# Patient Record
Sex: Male | Born: 1990 | Race: White | Hispanic: No | Marital: Single | Smoking: Current every day smoker
Health system: Southern US, Community
[De-identification: ages and names within clinical notes are randomized; demographics above are authoritative.]

## PROBLEM LIST (undated history)

## (undated) DIAGNOSIS — F909 Attention-deficit hyperactivity disorder, unspecified type: Secondary | ICD-10-CM

## (undated) DIAGNOSIS — F319 Bipolar disorder, unspecified: Secondary | ICD-10-CM

## (undated) HISTORY — PX: HERNIA REPAIR: SHX51

## (undated) HISTORY — PX: EYE SURGERY: SHX253

---

## 2017-10-24 ENCOUNTER — Other Ambulatory Visit: Payer: Self-pay

## 2017-10-24 ENCOUNTER — Emergency Department (HOSPITAL_BASED_OUTPATIENT_CLINIC_OR_DEPARTMENT_OTHER): Payer: Self-pay

## 2017-10-24 ENCOUNTER — Encounter (HOSPITAL_BASED_OUTPATIENT_CLINIC_OR_DEPARTMENT_OTHER): Payer: Self-pay | Admitting: Emergency Medicine

## 2017-10-24 ENCOUNTER — Observation Stay (HOSPITAL_BASED_OUTPATIENT_CLINIC_OR_DEPARTMENT_OTHER)
Admission: EM | Admit: 2017-10-24 | Discharge: 2017-10-25 | Disposition: A | Discharge status: Home / Self Care | Payer: Self-pay | Attending: Internal Medicine | Admitting: Internal Medicine

## 2017-10-24 DIAGNOSIS — K2970 Gastritis, unspecified, without bleeding: Secondary | ICD-10-CM

## 2017-10-24 DIAGNOSIS — R112 Nausea with vomiting, unspecified: Secondary | ICD-10-CM | POA: Insufficient documentation

## 2017-10-24 DIAGNOSIS — E860 Dehydration: Secondary | ICD-10-CM | POA: Insufficient documentation

## 2017-10-24 DIAGNOSIS — R109 Unspecified abdominal pain: Secondary | ICD-10-CM | POA: Insufficient documentation

## 2017-10-24 DIAGNOSIS — F1010 Alcohol abuse, uncomplicated: Secondary | ICD-10-CM | POA: Insufficient documentation

## 2017-10-24 DIAGNOSIS — N179 Acute kidney failure, unspecified: Principal | ICD-10-CM | POA: Insufficient documentation

## 2017-10-24 DIAGNOSIS — F17200 Nicotine dependence, unspecified, uncomplicated: Secondary | ICD-10-CM | POA: Insufficient documentation

## 2017-10-24 DIAGNOSIS — H5461 Unqualified visual loss, right eye, normal vision left eye: Secondary | ICD-10-CM | POA: Insufficient documentation

## 2017-10-24 HISTORY — DX: Bipolar disorder, unspecified: F31.9

## 2017-10-24 HISTORY — DX: Attention-deficit hyperactivity disorder, unspecified type: F90.9

## 2017-10-24 LAB — COMPREHENSIVE METABOLIC PANEL
ALT: 18 U/L (ref 0–44)
ANION GAP: 9 (ref 5–15)
AST: 15 U/L (ref 15–41)
Albumin: 4.2 g/dL (ref 3.5–5.0)
Alkaline Phosphatase: 55 U/L (ref 38–126)
BUN: 17 mg/dL (ref 6–20)
CALCIUM: 10.5 mg/dL — AB (ref 8.9–10.3)
CO2: 28 mmol/L (ref 22–32)
CREATININE: 2.03 mg/dL — AB (ref 0.61–1.24)
Chloride: 100 mmol/L (ref 98–111)
GFR calc Af Amer: 50 mL/min — ABNORMAL LOW (ref >60)
GFR calc non Af Amer: 43 mL/min — ABNORMAL LOW (ref >60)
Glucose, Bld: 113 mg/dL — ABNORMAL HIGH (ref 70–99)
POTASSIUM: 3.8 mmol/L (ref 3.5–5.1)
Sodium: 137 mmol/L (ref 135–145)
TOTAL PROTEIN: 7.1 g/dL (ref 6.5–8.1)
Total Bilirubin: 0.8 mg/dL (ref 0.3–1.2)

## 2017-10-24 LAB — URINALYSIS, ROUTINE W REFLEX MICROSCOPIC
Bilirubin Urine: NEGATIVE
Glucose, UA: NEGATIVE mg/dL
Hgb urine dipstick: NEGATIVE
Ketones, ur: NEGATIVE mg/dL
Leukocytes, UA: NEGATIVE
Nitrite: NEGATIVE
Protein, ur: NEGATIVE mg/dL
Specific Gravity, Urine: <1.005 — ABNORMAL LOW (ref 1.005–1.030)
pH: 6 (ref 5.0–8.0)

## 2017-10-24 LAB — CBC WITH DIFFERENTIAL/PLATELET
BASOS ABS: 0 10*3/uL (ref 0.0–0.1)
Basophils Relative: 0 %
Eosinophils Absolute: 0.1 10*3/uL (ref 0.0–0.7)
Eosinophils Relative: 1 %
HEMATOCRIT: 40.6 % (ref 39.0–52.0)
HEMOGLOBIN: 14.5 g/dL (ref 13.0–17.0)
Lymphocytes Relative: 18 %
Lymphs Abs: 1.9 10*3/uL (ref 0.7–4.0)
MCH: 34.7 pg — ABNORMAL HIGH (ref 26.0–34.0)
MCHC: 35.7 g/dL (ref 30.0–36.0)
MCV: 97.1 fL (ref 78.0–100.0)
Monocytes Absolute: 1 10*3/uL (ref 0.1–1.0)
Monocytes Relative: 10 %
NEUTROS ABS: 7.2 10*3/uL (ref 1.7–7.7)
NEUTROS PCT: 71 %
Platelets: 257 10*3/uL (ref 150–400)
RBC: 4.18 MIL/uL — ABNORMAL LOW (ref 4.22–5.81)
RDW: 12.3 % (ref 11.5–15.5)
WBC: 10.2 10*3/uL (ref 4.0–10.5)

## 2017-10-24 LAB — LIPASE, BLOOD: Lipase: 24 U/L (ref 11–51)

## 2017-10-24 LAB — RAPID URINE DRUG SCREEN, HOSP PERFORMED
Amphetamines: NOT DETECTED
Barbiturates: NOT DETECTED
Benzodiazepines: NOT DETECTED
Cocaine: NOT DETECTED
OPIATES: NOT DETECTED
Tetrahydrocannabinol: POSITIVE — AB

## 2017-10-24 LAB — CK: Total CK: 50 U/L (ref 49–397)

## 2017-10-24 LAB — ACETAMINOPHEN LEVEL: Acetaminophen (Tylenol), Serum: <10 ug/mL — ABNORMAL LOW (ref 10–30)

## 2017-10-24 MED ORDER — FAMOTIDINE IN NACL 20-0.9 MG/50ML-% IV SOLN
20.0000 mg | Freq: Once | INTRAVENOUS | Status: AC
  Administered 2017-10-24: 20 mg via INTRAVENOUS
  Filled 2017-10-24: qty 50

## 2017-10-24 MED ORDER — ONDANSETRON HCL 4 MG/2ML IJ SOLN: 4.0000 mg | Freq: Four times a day (QID) | INTRAMUSCULAR | Status: DC | PRN

## 2017-10-24 MED ORDER — SODIUM CHLORIDE 0.9 % IV SOLN
INTRAVENOUS | Status: DC
  Administered 2017-10-24 – 2017-10-25 (×2): via INTRAVENOUS

## 2017-10-24 MED ORDER — LORAZEPAM 2 MG/ML IJ SOLN: 1.0000 mg | Freq: Four times a day (QID) | INTRAMUSCULAR | Status: DC | PRN

## 2017-10-24 MED ORDER — ONDANSETRON HCL 4 MG/2ML IJ SOLN
4.0000 mg | Freq: Once | INTRAMUSCULAR | Status: AC
  Administered 2017-10-24: 4 mg via INTRAVENOUS
  Filled 2017-10-24: qty 2

## 2017-10-24 MED ORDER — THIAMINE HCL 100 MG/ML IJ SOLN
Freq: Once | INTRAVENOUS | Status: AC
  Administered 2017-10-24: 18:00:00 via INTRAVENOUS
  Filled 2017-10-24: qty 1000

## 2017-10-24 MED ORDER — HYDROCODONE-ACETAMINOPHEN 5-325 MG PO TABS
1.0000 | ORAL_TABLET | ORAL | Status: DC | PRN
  Administered 2017-10-25: 1 via ORAL
  Filled 2017-10-24: qty 2

## 2017-10-24 MED ORDER — SENNOSIDES-DOCUSATE SODIUM 8.6-50 MG PO TABS: 1.0000 | ORAL_TABLET | Freq: Every evening | ORAL | Status: DC | PRN

## 2017-10-24 MED ORDER — ACETAMINOPHEN 650 MG RE SUPP: 650.0000 mg | Freq: Four times a day (QID) | RECTAL | Status: DC | PRN

## 2017-10-24 MED ORDER — SODIUM CHLORIDE 0.9 % IV BOLUS
1000.0000 mL | Freq: Once | INTRAVENOUS | Status: AC
  Administered 2017-10-24: 1000 mL via INTRAVENOUS

## 2017-10-24 MED ORDER — HEPARIN SODIUM (PORCINE) 5000 UNIT/ML IJ SOLN
5000.0000 [IU] | Freq: Three times a day (TID) | INTRAMUSCULAR | Status: DC
  Administered 2017-10-24 – 2017-10-25 (×2): 5000 [IU] via SUBCUTANEOUS
  Filled 2017-10-24 (×2): qty 1

## 2017-10-24 MED ORDER — ACETAMINOPHEN 325 MG PO TABS: 650.0000 mg | ORAL_TABLET | Freq: Four times a day (QID) | ORAL | Status: DC | PRN

## 2017-10-24 MED ORDER — PANTOPRAZOLE SODIUM 20 MG PO TBEC
20.0000 mg | DELAYED_RELEASE_TABLET | Freq: Two times a day (BID) | ORAL | Status: DC
  Administered 2017-10-24 – 2017-10-25 (×2): 20 mg via ORAL
  Filled 2017-10-24 (×3): qty 1

## 2017-10-24 MED ORDER — LORAZEPAM 1 MG PO TABS: 1.0000 mg | ORAL_TABLET | Freq: Four times a day (QID) | ORAL | Status: DC | PRN

## 2017-10-24 MED ORDER — ONDANSETRON HCL 4 MG PO TABS: 4.0000 mg | ORAL_TABLET | Freq: Four times a day (QID) | ORAL | Status: DC | PRN

## 2017-10-24 MED ORDER — ZOLPIDEM TARTRATE 5 MG PO TABS
5.0000 mg | ORAL_TABLET | Freq: Every evening | ORAL | Status: DC | PRN
Start: 2017-10-24 — End: 2017-10-25

## 2017-10-24 NOTE — ED Triage Notes (Signed)
Pt reports epigastric, LLQ and LT flank pain x 2d; sts drank a lot of ETOH 3 days ago and "got sick"; last ETOH was last night (1/4 bottle liquor)

## 2017-10-24 NOTE — ED Notes (Signed)
Report to Deanna, RN at [facility].  

## 2017-10-24 NOTE — ED Provider Notes (Signed)
[facility] EMERGENCY DEPARTMENT Provider Note   CSN: [id number] Arrival date & time: 10/24/17  1105     History   Chief Complaint Chief Complaint  Patient presents with  . Abdominal Pain    HPI Terry Willis is a 27 y.o. male.  Patient is a 27 year old male with a history of heavy alcohol use who presents with abdominal pain.  He states that for the last 3 days he has had abdominal pain which is across his upper abdomen.  It started after an episode of vomiting.  He had been drinking heavily during the night and in the early morning hours started having vomiting.  He states he had 5-6 episodes of vomiting and then was having this abdominal pain.  He denies any ongoing vomiting although he is nauseated.  No change in bowel habits.  No difficulty urinating.  No known fevers.  No hematemesis or noticeable blood in his stool.  He states that he drinks about 3 40 ounce beers about 3 times a week.     History reviewed. No pertinent past medical history.  There are no active problems to display for this patient.   Past Surgical History:  Procedure Laterality Date  . EYE SURGERY    . HERNIA REPAIR          Home Medications    Prior to Admission medications   Not on File    Family History No family history on file.  Social History Social History   Tobacco Use  . Smoking status: Current Every Day Smoker  . Smokeless tobacco: Never Used  Substance Use Topics  . Alcohol use: Yes    Comment: 3  times a week binge drinker   . Drug use: Not Currently     Allergies   Patient has no known allergies.   Review of Systems Review of Systems  Constitutional: Positive for fatigue. Negative for chills, diaphoresis and fever.  HENT: Negative for congestion, rhinorrhea and sneezing.   Eyes: Negative.   Respiratory: Negative for cough, chest tightness and shortness of breath.   Cardiovascular: Negative for chest pain and leg swelling.  Gastrointestinal:  Positive for abdominal pain, nausea and vomiting. Negative for blood in stool and diarrhea.  Genitourinary: Negative for difficulty urinating, flank pain, frequency and hematuria.  Musculoskeletal: Negative for arthralgias and back pain.  Skin: Negative for rash.  Neurological: Negative for dizziness, speech difficulty, weakness, numbness and headaches.     Physical Exam Updated Vital Signs BP 123/76 (BP Location: Left Arm)   Pulse (!) 47   Temp 97.6 F (36.4 C) (Oral)   Resp 20   Ht 5\' 7"  (1.702 m)   Wt 74.8 kg (165 lb)   SpO2 98%   BMI 25.84 kg/m   Physical Exam  Constitutional: He is oriented to person, place, and time. He appears well-developed and well-nourished.  HENT:  Head: Normocephalic and atraumatic.  Eyes: Pupils are equal, round, and reactive to light.  Neck: Normal range of motion. Neck supple.  Cardiovascular: Normal rate, regular rhythm and normal heart sounds.  Pulmonary/Chest: Effort normal and breath sounds normal. No respiratory distress. He has no wheezes. He has no rales. He exhibits no tenderness.  Abdominal: Soft. Bowel sounds are normal. There is tenderness in the epigastric area. There is no rebound and no guarding.  Musculoskeletal: Normal range of motion. He exhibits no edema.  Lymphadenopathy:    He has no cervical adenopathy.  Neurological: He is alert and oriented to person,  place, and time.  Skin: Skin is warm and dry. No rash noted.  Psychiatric: He has a normal mood and affect.     ED Treatments / Results  Labs (all labs ordered are listed, but only abnormal results are displayed) Labs Reviewed  COMPREHENSIVE METABOLIC PANEL - Abnormal; Notable for the following components:      Result Value   Glucose, Bld 113 (*)    Creatinine, Ser 2.03 (*)    Calcium 10.5 (*)    GFR calc non Af Amer 43 (*)    GFR calc Af Amer 50 (*)    All other components within normal limits  CBC WITH DIFFERENTIAL/PLATELET - Abnormal; Notable for the following  components:   RBC 4.18 (*)    MCH 34.7 (*)    All other components within normal limits  URINALYSIS, ROUTINE W REFLEX MICROSCOPIC - Abnormal; Notable for the following components:   Specific Gravity, Urine <1.005 (*)    All other components within normal limits  RAPID URINE DRUG SCREEN, HOSP PERFORMED - Abnormal; Notable for the following components:   Tetrahydrocannabinol POSITIVE (*)    All other components within normal limits  ACETAMINOPHEN LEVEL - Abnormal; Notable for the following components:   Acetaminophen (Tylenol), Serum <10 (*)    All other components within normal limits  LIPASE, BLOOD  CK    EKG None  Radiology Ct Renal Stone Study  Result Date: 10/24/2017 CLINICAL DATA:  Left flank pain worsening over the last several days. EXAM: CT ABDOMEN AND PELVIS WITHOUT CONTRAST TECHNIQUE: Multidetector CT imaging of the abdomen and pelvis was performed following the standard protocol without IV contrast. COMPARISON:  None. FINDINGS: Lower chest: Normal Hepatobiliary: Normal Pancreas: Normal Spleen: Normal Adrenals/Urinary Tract: Adrenal glands are normal. Kidneys are normal. No cyst, mass, stone or hydronephrosis. One could question a 1 mm stone in the upper pole of the left kidney but this is not definite. There is no evidence of passing stone. No stone in the bladder. Stomach/Bowel: Normal Vascular/Lymphatic: Normal Reproductive: Normal Other: No free fluid or air. Musculoskeletal: Chronic bilateral pars defects at L5. IMPRESSION: No acute or likely significant finding. No definite evidence of urinary tract stone disease. Question a 1 mm nonobstructing stone in the upper pole of the left kidney, not definite. Chronic bilateral pars defects at L5. Electronically Signed   By: Paulina Fusi M.D.   On: 10/24/2017 13:43    Procedures Procedures (including critical care time)  Medications Ordered in ED Medications  sodium chloride 0.9 % bolus 1,000 mL ( Intravenous Stopped 10/24/17 1302)    ondansetron (ZOFRAN) injection 4 mg (4 mg Intravenous Given 10/24/17 1158)  famotidine (PEPCID) IVPB 20 mg premix ( Intravenous Stopped 10/24/17 1230)     Initial Impression / Assessment and Plan / ED Course  I have reviewed the triage vital signs and the nursing notes.  Pertinent labs & imaging results that were available during my care of the patient were reviewed by me and considered in my medical decision making (see chart for details).     Patient is a 27 year old male who presents with vomiting and abdominal pain after drinking alcohol.  He has no evidence of pancreatitis.  His LFTs are normal.  He was given IV fluids and Zofran along with Pepcid.  He is feeling better but still has some pain along his back which is likely muscular.  I did do a CT scan and there is no evidence of kidney stone or other acute abnormality.  He does have an acute kidney injury.  Given his dehydration, abdominal pain and acute kidney injury with no mechanism for outpatient follow-up, I did feel that he would benefit from hospitalization, IV fluids and reevaluation of his kidney function.  I spoke with Dr. Lowell GuitarPowell who will admit the pt.  Final Clinical Impressions(s) / ED Diagnoses   Final diagnoses:  Dehydration  Gastritis without bleeding, unspecified chronicity, unspecified gastritis type  AKI (acute kidney injury) [facility](HCC)    ED Discharge Orders    None       Rolan BuccoBelfi, Graycen Sadlon, MD 10/24/17 1441

## 2017-10-24 NOTE — Treatment Plan (Signed)
27 yo with hx of etoh presenting with N/V and abdominal pain.  Labs notable for acute kidney injury and mildly elevated calcium.  CT without notable findings.  He's persistently symptomatic.  ED requesting admit for nausea, vomiting, abdominal pain and acute kidney injury.  Will admit for observation.

## 2017-10-24 NOTE — H&P (Signed)
History and Physical    Terry HildingStephen Willis UJW:[id number]:[id number] DOB: [birth year] DOA: 10/24/2017  PCP: Patient, No Pcp Per  Patient coming from: Home, [facility] transfer  Chief Complaint: Nausea, vomiting, right flank pain, abdominal pain   HPI: Terry HildingStephen Willis is a 27 y.o. 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  He states that 3 nights ago, he drank about 3 x 40 ounces of beer.  This is typical for him.  He drinks about 3 times weekly.  The next day, he started having nausea, vomiting.  He also continued to have right flank pain, bilateral lower abdominal pain, epigastric abdominal pain.  He thought sleeping might help, but it did not.  He admits to nausea, vomiting without any diarrhea.  He denies any fevers, chest pain, cough, dysuria.  His last alcohol intake was 8/1.  ED Course: Labs obtained which revealed acute kidney injury with creatinine 2.  He was given Zofran, IV Pepcid, IV fluid and [facility] treatment for acute kidney injury.  Review of Systems: As per HPI otherwise 10 point review of systems negative.   Past Medical History:  Diagnosis Date  . ADHD   . Bipolar disorder [facility]HCC)     Past Surgical History:  Procedure Laterality Date  . EYE SURGERY    . HERNIA REPAIR       reports that he has been smoking.  He has never used smokeless tobacco. He reports that he drinks alcohol. He reports that he has current or past drug history.  No Known Allergies  Family History  Problem Relation Age of Onset  . Alzheimer's disease Other   . Alzheimer's disease Other   . Pancreatic cancer Other     Prior to Admission medications   Not on File    Physical Exam: Vitals:   10/24/17 1114 10/24/17 1415 10/24/17 1608 10/24/17 1714  BP:  123/76 117/63 129/72  Pulse:  (!) 47 (!) 48 (!) 51  Resp:  20 16 19     Temp:   98.3 F (36.8 C) 98.5 F (36.9 C)  TempSrc:   Oral Oral  SpO2:  98% 98% 99%  Weight: 74.8 kg (165 lb)     Height: 5\' 7"  (1.702 m)        Constitutional: NAD, calm, comfortable Eyes: PERRL, lids and conjunctivae normal, blind in right eye ENMT: Mucous membranes are moist. Posterior pharynx clear of any exudate or lesions.Normal dentition.  Neck: normal, supple, no masses, no thyromegaly Respiratory: clear to auscultation bilaterally, no wheezing, no crackles. Normal respiratory effort. No accessory muscle use.  Cardiovascular: Regular rate and rhythm, no murmurs / rubs / gallops. No extremity edema. Abdomen: no tenderness, no masses palpated. No hepatosplenomegaly. Bowel sounds positive.  Musculoskeletal: no clubbing / cyanosis. No joint deformity upper and lower extremities. Good ROM, no contractures. Normal muscle tone.  Skin: no rashes, lesions, ulcers. No induration Neurologic: CN 2-12 grossly intact. Strength equal in all 4.  Psychiatric: Normal judgment and insight. Alert and oriented x 3. Normal mood.   Labs on Admission: I have personally reviewed following labs and imaging studies  CBC: Recent Labs  Lab 10/24/17 1153  WBC 10.2  NEUTROABS 7.2  HGB 14.5  HCT 40.6  MCV 97.1  PLT 257   Basic Metabolic Panel: Recent Labs  Lab 10/24/17 1153  NA 137  K 3.8  CL  100  CO2 28  GLUCOSE 113*  BUN 17  CREATININE 2.03*  CALCIUM 10.5*   GFR: Estimated Creatinine Clearance: 50.7 mL/min (A) (by C-G formula based on SCr of 2.03 mg/dL (H)). Liver Function Tests: Recent Labs  Lab 10/24/17 1153  AST 15  ALT 18  ALKPHOS 55  BILITOT 0.8  PROT 7.1  ALBUMIN 4.2   Recent Labs  Lab 10/24/17 1153  LIPASE 24   No results for input(s): AMMONIA in the last 168 hours. Coagulation Profile: No results for input(s): INR, PROTIME in the last 168 hours. Cardiac Enzymes: Recent Labs  Lab 10/24/17 1240  CKTOTAL 50   BNP (last 3 results) No results for input(s):  PROBNP in the last 8760 hours. HbA1C: No results for input(s): HGBA1C in the last 72 hours. CBG: No results for input(s): GLUCAP in the last 168 hours. Lipid Profile: No results for input(s): CHOL, HDL, LDLCALC, TRIG, CHOLHDL, LDLDIRECT in the last 72 hours. Thyroid Function Tests: No results for input(s): TSH, T4TOTAL, FREET4, T3FREE, THYROIDAB in the last 72 hours. Anemia Panel: No results for input(s): VITAMINB12, FOLATE, FERRITIN, TIBC, IRON, RETICCTPCT in the last 72 hours. Urine analysis:    Component Value Date/Time   COLORURINE YELLOW 10/24/2017 1240   APPEARANCEUR CLEAR 10/24/2017 1240   LABSPEC <1.005 (L) 10/24/2017 1240   PHURINE 6.0 10/24/2017 1240   GLUCOSEU NEGATIVE 10/24/2017 1240   HGBUR NEGATIVE 10/24/2017 1240   BILIRUBINUR NEGATIVE 10/24/2017 1240   KETONESUR NEGATIVE 10/24/2017 1240   PROTEINUR NEGATIVE 10/24/2017 1240   NITRITE NEGATIVE 10/24/2017 1240   LEUKOCYTESUR NEGATIVE 10/24/2017 1240   Sepsis Labs: !!!!!!!!!!!!!!!!!!!!!!!!!!!!!!!!!!!!!!!!!!!! @LABRCNTIP (procalcitonin:4,lacticidven:4) )No results found for this or any previous visit (from the past 240 hour(s)).   Radiological Exams on Admission: Ct Renal Stone Study  Result Date: 10/24/2017 CLINICAL DATA:  Left flank pain worsening over the last several days. EXAM: CT ABDOMEN AND PELVIS WITHOUT CONTRAST TECHNIQUE: Multidetector CT imaging of the abdomen and pelvis was performed following the standard protocol without IV contrast. COMPARISON:  None. FINDINGS: Lower chest: Normal Hepatobiliary: Normal Pancreas: Normal Spleen: Normal Adrenals/Urinary Tract: Adrenal glands are normal. Kidneys are normal. No cyst, mass, stone or hydronephrosis. One could question a 1 mm stone in the upper pole of the left kidney but this is not definite. There is no evidence of passing stone. No stone in the bladder. Stomach/Bowel: Normal Vascular/Lymphatic: Normal Reproductive: Normal Other: No free fluid or air.  Musculoskeletal: Chronic bilateral pars defects at L5. IMPRESSION: No acute or likely significant finding. No definite evidence of urinary tract stone disease. Question a 1 mm nonobstructing stone in the upper pole of the left kidney, not definite. Chronic bilateral pars defects at L5. Electronically Signed   By: Paulina Fusi M.D.   On: 10/24/2017 13:43     Assessment/Plan Principal Problem:   Acute kidney injury (HCC)   AKI -CK 50  -UDS positive for THC  -UA negative for infection -CT renal: No acute or likely significant finding. No definite evidence of urinary tract stone disease -This is likely secondary to prerenal, dehydration due to 3 days of nausea and vomiting.  Continue IV fluids and repeat BMP in the morning  Abdominal pain with nausea, vomiting -Lipase normal, CT without evidence of pancreatitis -Supportive care -PPI   Alcohol abuse -CIWA    DVT prophylaxis: Subcu heparin Code Status: Full  Family Communication: No family at bedside Disposition Plan: Pending clinical improvement, suspect can return home Consults called: None  Admission status: Observation  Severity of Illness: The appropriate patient status for this patient is OBSERVATION. Observation status is judged to be reasonable and necessary in order to provide the required intensity of service to ensure the patient's safety. The patient's presenting symptoms, physical exam findings, and initial radiographic and laboratory data in the context of their medical condition is felt to place them at decreased risk for further clinical deterioration. Furthermore, it is anticipated that the patient will be medically stable for discharge from the hospital within 2 midnights of admission.    Noralee Stain, DO [facility] [email or web address] Password [facility] 10/24/2017, 5:31 PM

## 2017-10-24 NOTE — Plan of Care (Signed)
Pt received from [facility]; lying in bed, saline lock to right A/C; flushed well. A&Ox4; pain reported as burning/aching type pain in right flank around to back, mid-epigastric area and low abdominal/suprapubic area; while lying down pain is 5/10; when sitting up ranges from 6-8/10. No additional concerns noted at this time. Will review orders and continue to monitor.

## 2017-10-25 LAB — CBC
HCT: 37.5 % — ABNORMAL LOW (ref 39.0–52.0)
Hemoglobin: 12.7 g/dL — ABNORMAL LOW (ref 13.0–17.0)
MCH: 33.9 pg (ref 26.0–34.0)
MCHC: 33.9 g/dL (ref 30.0–36.0)
MCV: 100 fL (ref 78.0–100.0)
PLATELETS: 246 10*3/uL (ref 150–400)
RBC: 3.75 MIL/uL — AB (ref 4.22–5.81)
RDW: 12.8 % (ref 11.5–15.5)
WBC: 9.2 10*3/uL (ref 4.0–10.5)

## 2017-10-25 LAB — COMPREHENSIVE METABOLIC PANEL
ALT: 38 U/L (ref 0–44)
AST: 39 U/L (ref 15–41)
Albumin: 3.5 g/dL (ref 3.5–5.0)
Alkaline Phosphatase: 55 U/L (ref 38–126)
Anion gap: 5 (ref 5–15)
BUN: 16 mg/dL (ref 6–20)
CO2: 25 mmol/L (ref 22–32)
CREATININE: 1.56 mg/dL — AB (ref 0.61–1.24)
Calcium: 9.9 mg/dL (ref 8.9–10.3)
Chloride: 110 mmol/L (ref 98–111)
GFR calc Af Amer: >60 mL/min (ref >60)
GFR, EST NON AFRICAN AMERICAN: 59 mL/min — AB (ref >60)
Glucose, Bld: 113 mg/dL — ABNORMAL HIGH (ref 70–99)
Potassium: 4.1 mmol/L (ref 3.5–5.1)
Sodium: 140 mmol/L (ref 135–145)
TOTAL PROTEIN: 5.8 g/dL — AB (ref 6.5–8.1)
Total Bilirubin: 0.7 mg/dL (ref 0.3–1.2)

## 2017-10-25 LAB — HIV ANTIBODY (ROUTINE TESTING W REFLEX): HIV Screen 4th Generation wRfx: NONREACTIVE

## 2017-10-25 MED ORDER — ONDANSETRON 4 MG PO TBDP: 4.0000 mg | ORAL_TABLET | Freq: Three times a day (TID) | ORAL | 0 refills | Status: AC | PRN

## 2017-10-25 MED ORDER — ONDANSETRON 4 MG PO TBDP: 4.0000 mg | ORAL_TABLET | Freq: Three times a day (TID) | ORAL | 0 refills | Status: DC | PRN

## 2017-10-25 MED ORDER — PANTOPRAZOLE SODIUM 20 MG PO TBEC: 20.0000 mg | DELAYED_RELEASE_TABLET | Freq: Two times a day (BID) | ORAL | 0 refills | Status: DC

## 2017-10-25 MED ORDER — PANTOPRAZOLE SODIUM 20 MG PO TBEC: 20.0000 mg | DELAYED_RELEASE_TABLET | Freq: Two times a day (BID) | ORAL | 0 refills | Status: AC

## 2017-10-25 NOTE — Progress Notes (Signed)
Discharge instructions reviewed with patient. All questions answered. Patient ambulated to vehicle with belongings by nurse tech 

## 2017-10-25 NOTE — Discharge Instructions (Signed)

## 2017-10-25 NOTE — Discharge Summary (Signed)
Physician Discharge Summary  Terry HildingStephen Bibb VHQ:[id number] DOB: 09/18/1990 DOA: 10/24/2017  PCP: Patient, No Pcp Per  Admit date: 10/24/2017 Discharge date: 10/25/2017  Admitted From: Home Disposition:  Home  Recommendations for Outpatient Follow-up:  1. Follow up with PCP in 1 week. Establish with [facility].  2. Please obtain BMP in 1 week  3. Refrain from alcohol use, stay hydrated   Discharge Condition: Stable CODE STATUS: Full  Diet recommendation: Regular diet   Brief/Interim Summary: Terry Willis is a 27 y.o. 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  He states that 3 nights ago, he drank about 3 x 40 ounces of beer.  This is typical for him.  He drinks about 3 times weekly.  The next day, he started having nausea, vomiting.  He also continued to have right flank pain, bilateral lower abdominal pain, epigastric abdominal pain.  He thought sleeping might help, but it did not.  He admits to nausea, vomiting without any diarrhea.  He denies any fevers, chest pain, cough, dysuria.  His last alcohol intake was 8/1. Labs obtained which revealed acute kidney injury with creatinine 2.  He was given Zofran, IV Pepcid, IV fluid and [facility] further treatment for acute kidney injury.  Discharge Diagnoses:  Principal Problem:   Acute kidney injury (HCC)  AKI -CK 50  -UDS positive for THC  -UA negative for infection -CT renal: No acute or likely significant finding. No definite evidence of urinary tract stone disease -This is likely secondary to prerenal, dehydration due to 3 days of nausea and vomiting -Cr improved with IVF  Abdominal pain with nausea, vomiting -Lipase normal, CT without evidence of pancreatitis -Supportive care -PPI  -No further nausea, vomiting. Tolerating meals. Abdominal  pain improved.   Alcohol abuse -Encourage cessation    Discharge Instructions  Discharge Instructions    Call MD for:  difficulty breathing, headache or visual disturbances   Complete by:  As directed    Call MD for:  extreme fatigue   Complete by:  As directed    Call MD for:  hives   Complete by:  As directed    Call MD for:  persistant dizziness or light-headedness   Complete by:  As directed    Call MD for:  persistant nausea and vomiting   Complete by:  As directed    Call MD for:  severe uncontrolled pain   Complete by:  As directed    Call MD for:  temperature >100.4   Complete by:  As directed    Diet general   Complete by:  As directed    Discharge instructions   Complete by:  As directed    You were cared for by a hospitalist during your hospital stay. If you have any questions about your discharge medications or the care you received while you were in the hospital after you are discharged, you can call the unit and ask to speak with the hospitalist on call if the hospitalist that took care of you is not available. Once you are discharged, your primary care physician will handle any further medical issues. Please note that NO REFILLS for any discharge medications will be authorized once you are discharged, as it is imperative that you return to your primary care physician (or establish a relationship with a primary care physician if  you do not have one) for your aftercare needs so that they can reassess your need for medications and monitor your lab values.   Increase activity slowly   Complete by:  As directed      Allergies as of 10/25/2017   No Known Allergies     Medication List    TAKE these medications   ondansetron 4 MG disintegrating tablet Commonly known as:  ZOFRAN ODT Take 1 tablet (4 mg total) by mouth every 8 (eight) hours as needed for nausea or vomiting.   pantoprazole 20 MG tablet Commonly known as:  PROTONIX Take 1 tablet (20 mg total) by mouth  2 (two) times daily before a meal.      Follow-up Information    [facility] Follow up.   Why:  Call to establish with a primary care physician  Contact information: [address] [place] [address] [phone]         No Known Allergies  Consultations:  None    Procedures/Studies: Ct Renal Stone Study  Result Date: 10/24/2017 CLINICAL DATA:  Left flank pain worsening over the last several days. EXAM: CT ABDOMEN AND PELVIS WITHOUT CONTRAST TECHNIQUE: Multidetector CT imaging of the abdomen and pelvis was performed following the standard protocol without IV contrast. COMPARISON:  None. FINDINGS: Lower chest: Normal Hepatobiliary: Normal Pancreas: Normal Spleen: Normal Adrenals/Urinary Tract: Adrenal glands are normal. Kidneys are normal. No cyst, mass, stone or hydronephrosis. One could question a 1 mm stone in the upper pole of the left kidney but this is not definite. There is no evidence of passing stone. No stone in the bladder. Stomach/Bowel: Normal Vascular/Lymphatic: Normal Reproductive: Normal Other: No free fluid or air. Musculoskeletal: Chronic bilateral pars defects at L5. IMPRESSION: No acute or likely significant finding. No definite evidence of urinary tract stone disease. Question a 1 mm nonobstructing stone in the upper pole of the left kidney, not definite. Chronic bilateral pars defects at L5. Electronically Signed   By: Paulina Fusi M.D.   On: 10/24/2017 13:43      Discharge Exam: Vitals:   10/24/17 1714 10/24/17 2110  BP: 129/72 127/74  Pulse: (!) 51 (!) 48  Resp: 19 18  Temp: 98.5 F (36.9 C) 98.3 F (36.8 C)  SpO2: 99% 97%    General: Pt is alert, awake, not in acute distress Cardiovascular: Regular rhythm, S1/S2 +, no rubs, no gallops Respiratory: CTA bilaterally, no wheezing, no rhonchi Abdominal: Soft, NT, ND, bowel sounds + Extremities: no edema, no cyanosis    The results of significant  diagnostics from this hospitalization (including imaging, microbiology, ancillary and laboratory) are listed below for reference.     Microbiology: No results found for this or any previous visit (from the past 240 hour(s)).   Labs: BNP (last 3 results) No results for input(s): BNP in the last 8760 hours. Basic Metabolic Panel: Recent Labs  Lab 10/24/17 1153 10/25/17 0447  NA 137 140  K 3.8 4.1  CL 100 110  CO2 28 25  GLUCOSE 113* 113*  BUN 17 16  CREATININE 2.03* 1.56*  CALCIUM 10.5* 9.9   Liver Function Tests: Recent Labs  Lab 10/24/17 1153 10/25/17 0447  AST 15 39  ALT 18 38  ALKPHOS 55 55  BILITOT 0.8 0.7  PROT 7.1 5.8*  ALBUMIN 4.2 3.5   Recent Labs  Lab 10/24/17 1153  LIPASE 24   No results for input(s): AMMONIA in the last 168 hours. CBC:  Recent Labs  Lab 10/24/17 1153 10/25/17 0447  WBC 10.2 9.2  NEUTROABS 7.2  --   HGB 14.5 12.7*  HCT 40.6 37.5*  MCV 97.1 100.0  PLT 257 246   Cardiac Enzymes: Recent Labs  Lab 10/24/17 1240  CKTOTAL 50   BNP: Invalid input(s): POCBNP CBG: No results for input(s): GLUCAP in the last 168 hours. D-Dimer No results for input(s): DDIMER in the last 72 hours. Hgb A1c No results for input(s): HGBA1C in the last 72 hours. Lipid Profile No results for input(s): CHOL, HDL, LDLCALC, TRIG, CHOLHDL, LDLDIRECT in the last 72 hours. Thyroid function studies No results for input(s): TSH, T4TOTAL, T3FREE, THYROIDAB in the last 72 hours.  Invalid input(s): FREET3 Anemia work up No results for input(s): VITAMINB12, FOLATE, FERRITIN, TIBC, IRON, RETICCTPCT in the last 72 hours. Urinalysis    Component Value Date/Time   COLORURINE YELLOW 10/24/2017 1240   APPEARANCEUR CLEAR 10/24/2017 1240   LABSPEC <1.005 (L) 10/24/2017 1240   PHURINE 6.0 10/24/2017 1240   GLUCOSEU NEGATIVE 10/24/2017 1240   HGBUR NEGATIVE 10/24/2017 1240   BILIRUBINUR NEGATIVE 10/24/2017 1240   KETONESUR NEGATIVE 10/24/2017 1240   PROTEINUR  NEGATIVE 10/24/2017 1240   NITRITE NEGATIVE 10/24/2017 1240   LEUKOCYTESUR NEGATIVE 10/24/2017 1240   Sepsis Labs Invalid input(s): PROCALCITONIN,  WBC,  LACTICIDVEN Microbiology No results found for this or any previous visit (from the past 240 hour(s)).   Patient was seen and examined on the day of discharge and was found to be in stable condition. Time coordinating discharge: 25 minutes including assessment and coordination of care, as well as examination of the patient.   SIGNED:  Noralee Stain, DO [facility] Pager [phone]  If 7PM-7AM, please contact night-coverage [email or web address] Password [facility] 10/25/2017, 9:38 AM

## 2019-12-11 IMAGING — CT CT RENAL STONE PROTOCOL
2 of 4 series · 17 of 46 positions shown, 19 images · non-contrast
Comparison: None.

CLINICAL DATA: Left flank pain worsening over the last several
days.

EXAM:
CT ABDOMEN AND PELVIS WITHOUT CONTRAST
TECHNIQUE: Multidetector CT imaging of the abdomen and pelvis was performed
following the standard protocol without IV contrast.

[Series 2: axial st · axial · 0.71mm/px · z∈[-496,-71]mm · 14 of 93 slices shown, 16 images]
[im 4/93  soft-tissue]
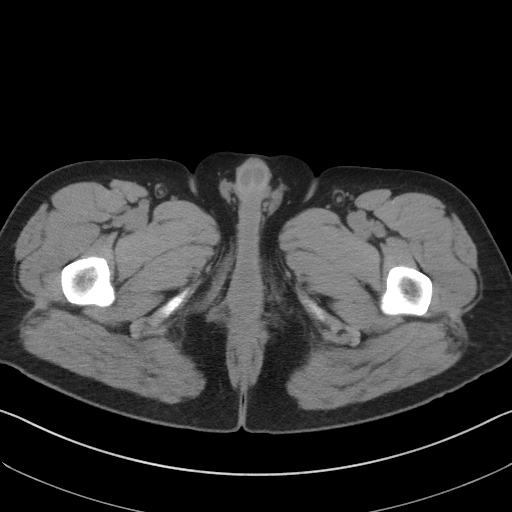
[im 4/93  bone]
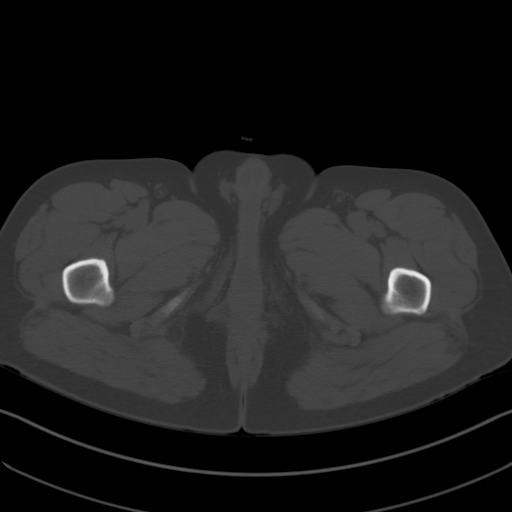
[im 12/93  soft-tissue]
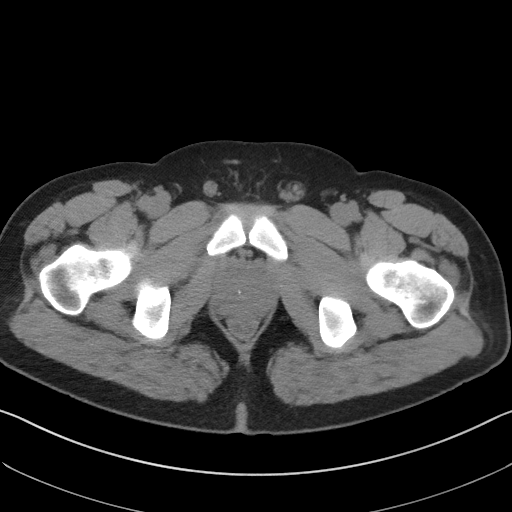
[im 19/93  soft-tissue]
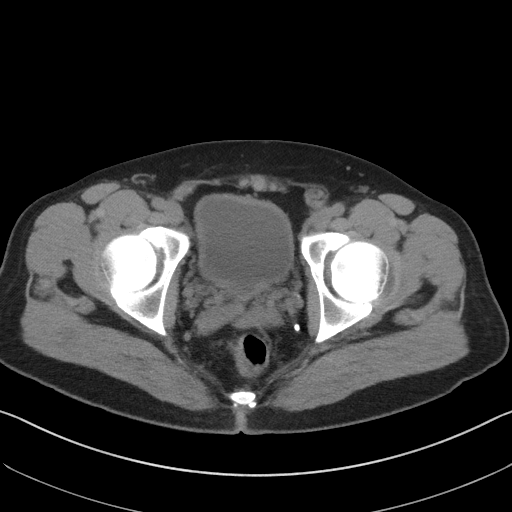
[im 26/93  soft-tissue]
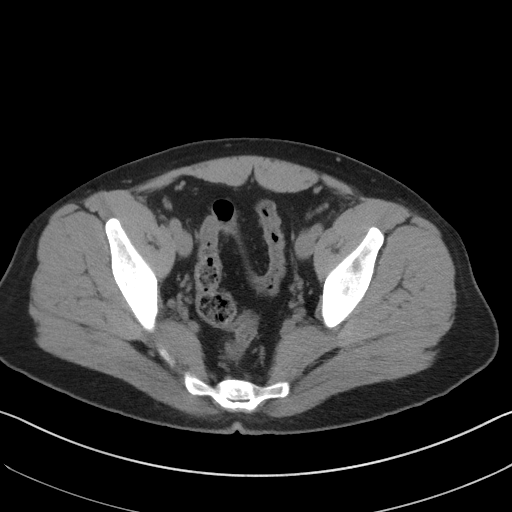
[im 30/93  soft-tissue]
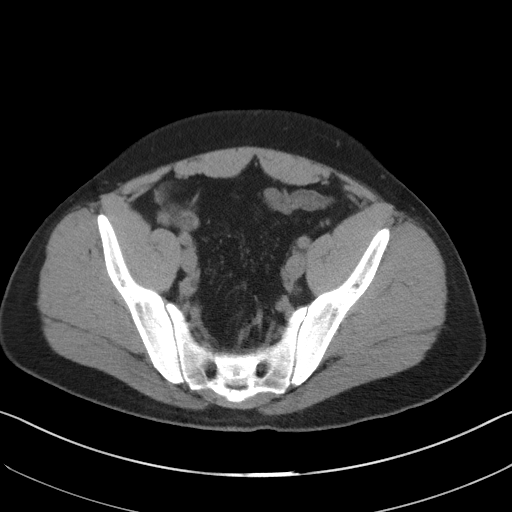
[im 37/93  soft-tissue]
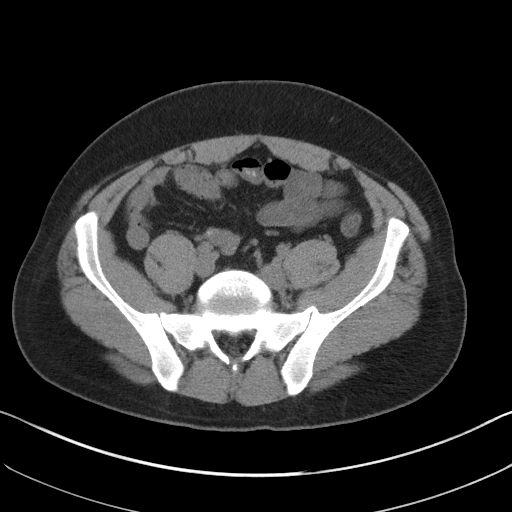
[im 45/93  soft-tissue]
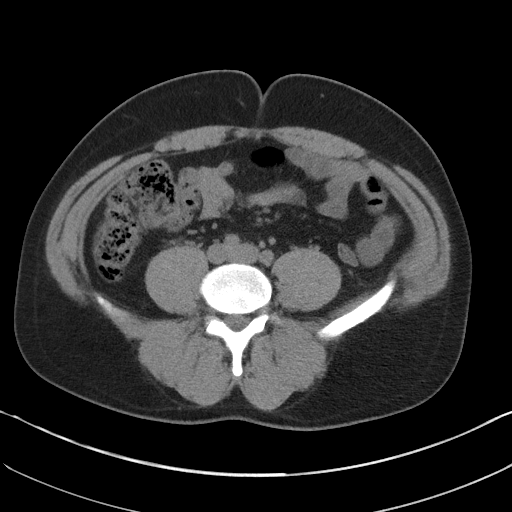
[im 48/93  soft-tissue]
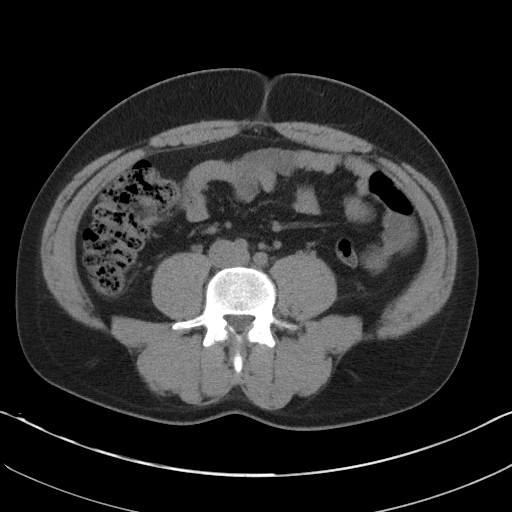
[im 56/93  soft-tissue]
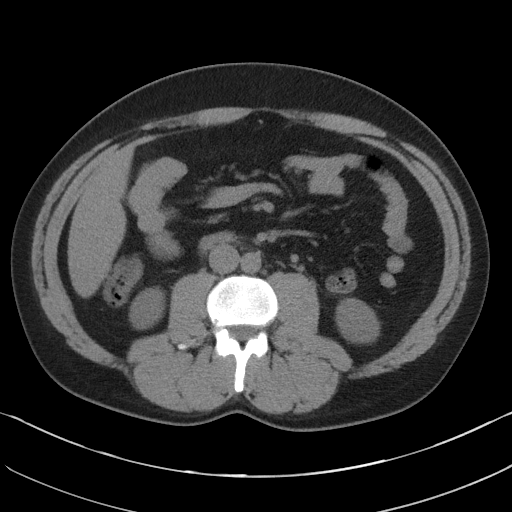
[im 56/93  bone]
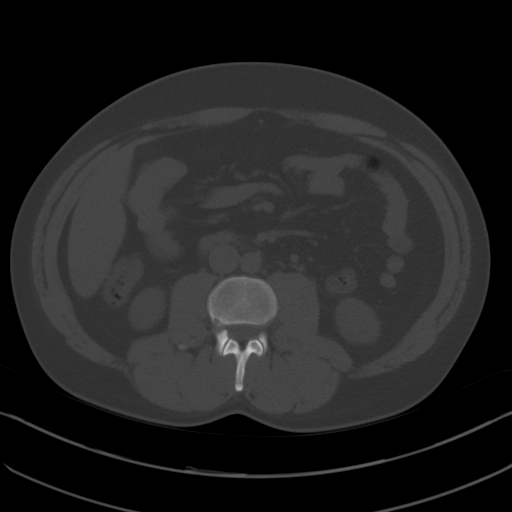
[im 63/93  soft-tissue]
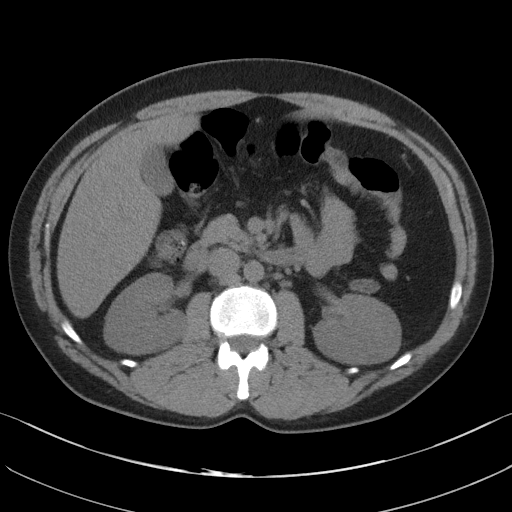
[im 70/93  soft-tissue]
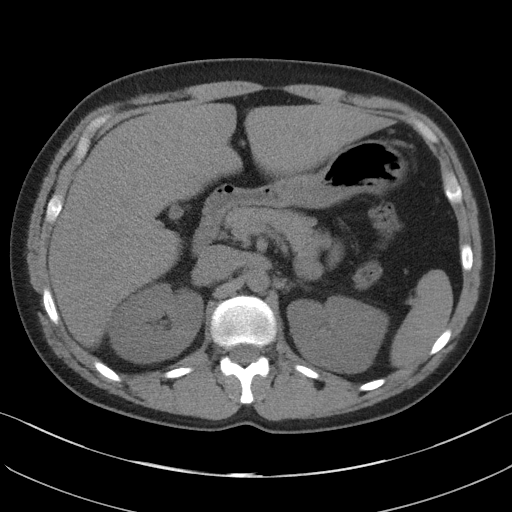
[im 74/93  soft-tissue]
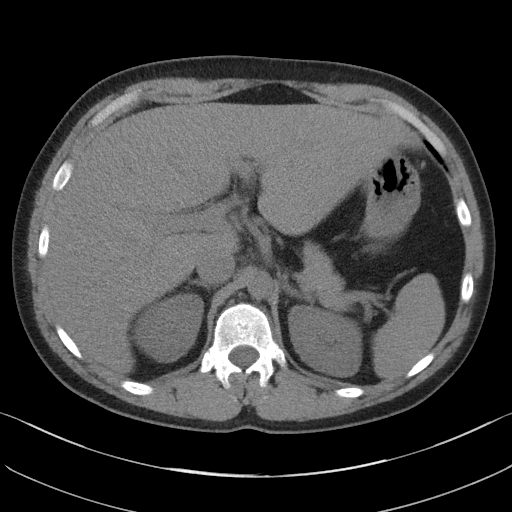
[im 81/93  soft-tissue]
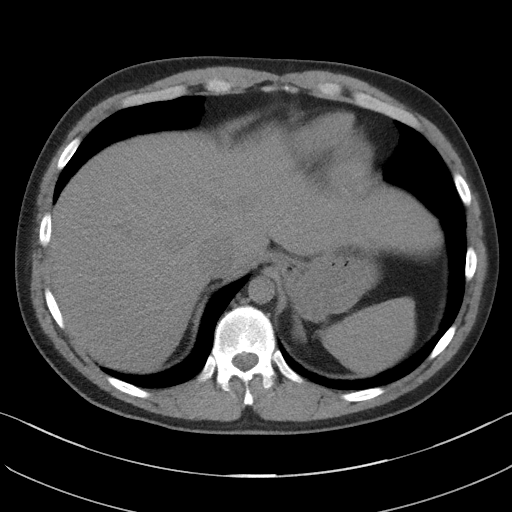
[im 89/93  soft-tissue]
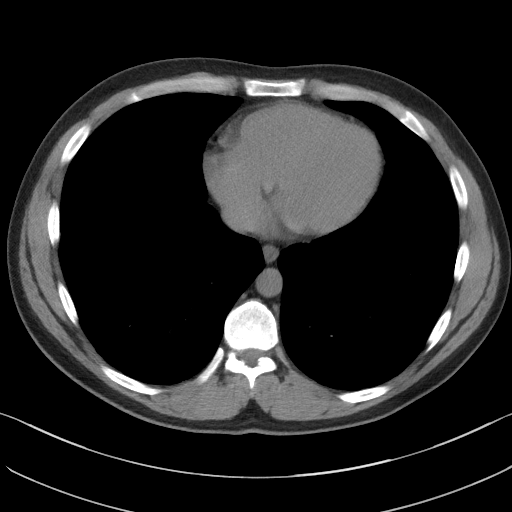

[Series 5: coronal st · coronal · 0.85mm/px · 3 of 101 slices shown]
[im 34/101  soft-tissue]
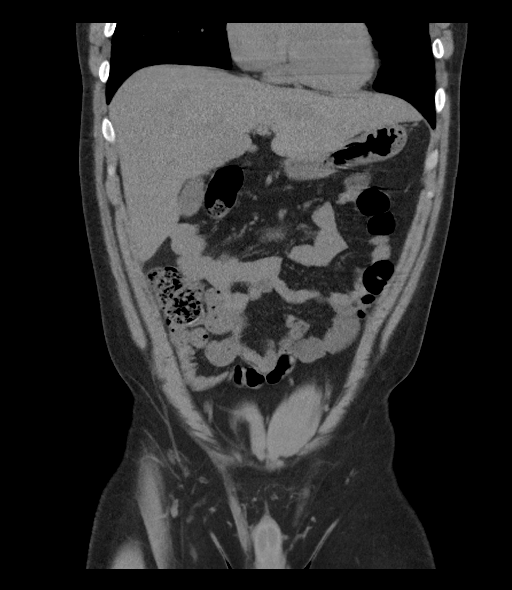
[im 45/101  soft-tissue]
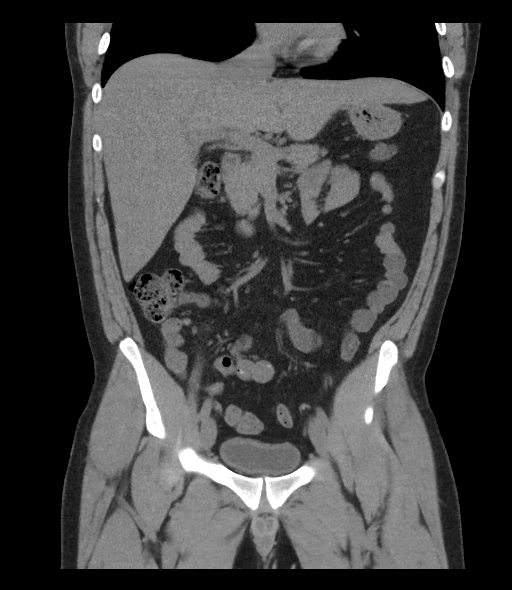
[im 56/101  soft-tissue]
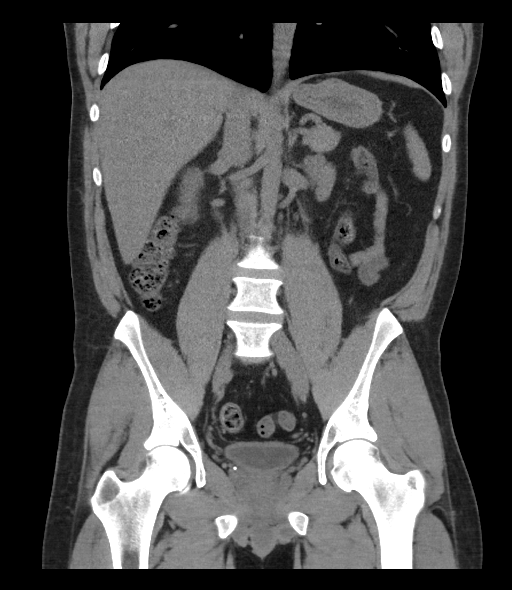

[17 of 46 positions shown; findings below may reference images not displayed]

FINDINGS: Lower chest: Normal

Hepatobiliary: Normal

Pancreas: Normal

Spleen: Normal

Adrenals/Urinary Tract: Adrenal glands are normal. Kidneys are
normal. No cyst, mass, stone or hydronephrosis. One could question a
1 mm stone in the upper pole of the left kidney but this is not
definite. There is no evidence of passing stone. No stone in the
bladder.

Stomach/Bowel: Normal

Vascular/Lymphatic: Normal

Reproductive: Normal

Other: No free fluid or air.

Musculoskeletal: Chronic bilateral pars defects at L5.
IMPRESSION: No acute or likely significant finding. No definite evidence of
urinary tract stone disease. Question a 1 mm nonobstructing stone in
the upper pole of the left kidney, not definite.

Chronic bilateral pars defects at L5.
